# Patient Record
Sex: Male | Born: 1995 | Race: Black or African American | Hispanic: No | Marital: Single | Smoking: Never smoker
Health system: Southern US, Community
[De-identification: ages and names within clinical notes are randomized; demographics above are authoritative.]

## PROBLEM LIST (undated history)

## (undated) DIAGNOSIS — J45909 Unspecified asthma, uncomplicated: Secondary | ICD-10-CM

## (undated) DIAGNOSIS — R011 Cardiac murmur, unspecified: Secondary | ICD-10-CM

---
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
## 2017-01-02 IMAGING — DX DG FOOT COMPLETE 3+V*L*
3 series · 3 of 3 positions shown · non-contrast
Comparison: None.

CLINICAL DATA: Fall while playing basketball today. Left foot pain
located medially and laterally and at the anterior ankle. Initial
encounter.

EXAM:
LEFT FOOT - COMPLETE 3+ VIEW

[x foot lat left]
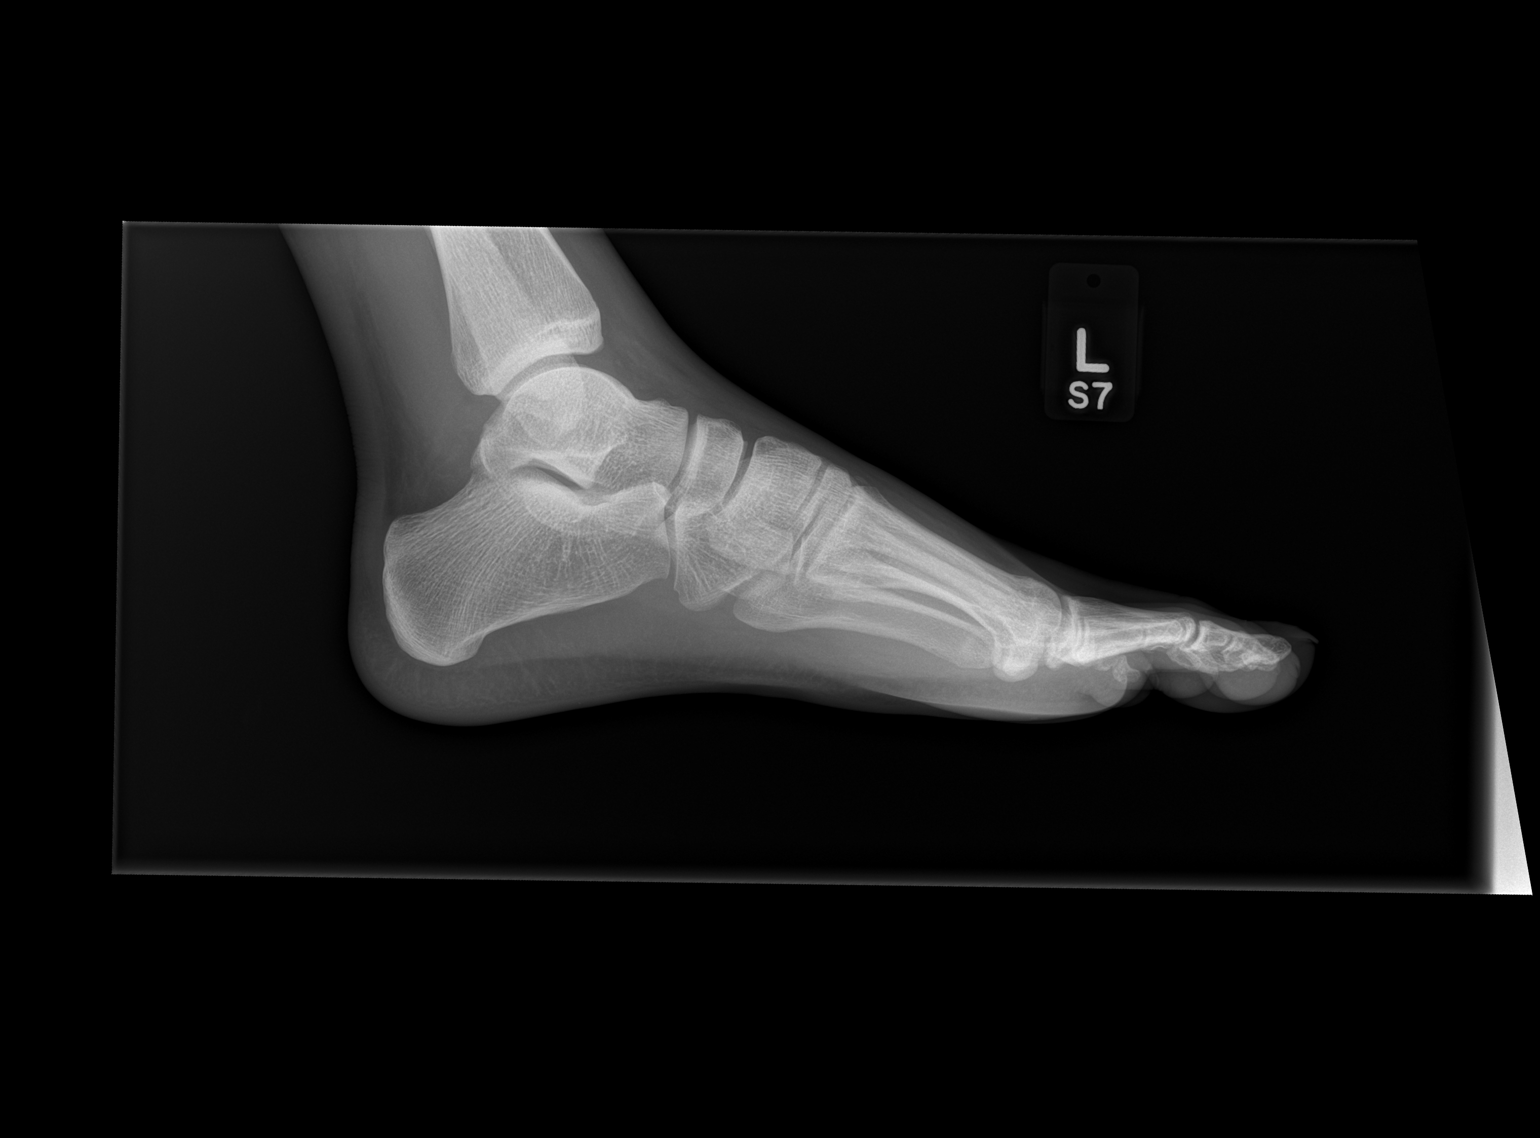

[x foot obl left]
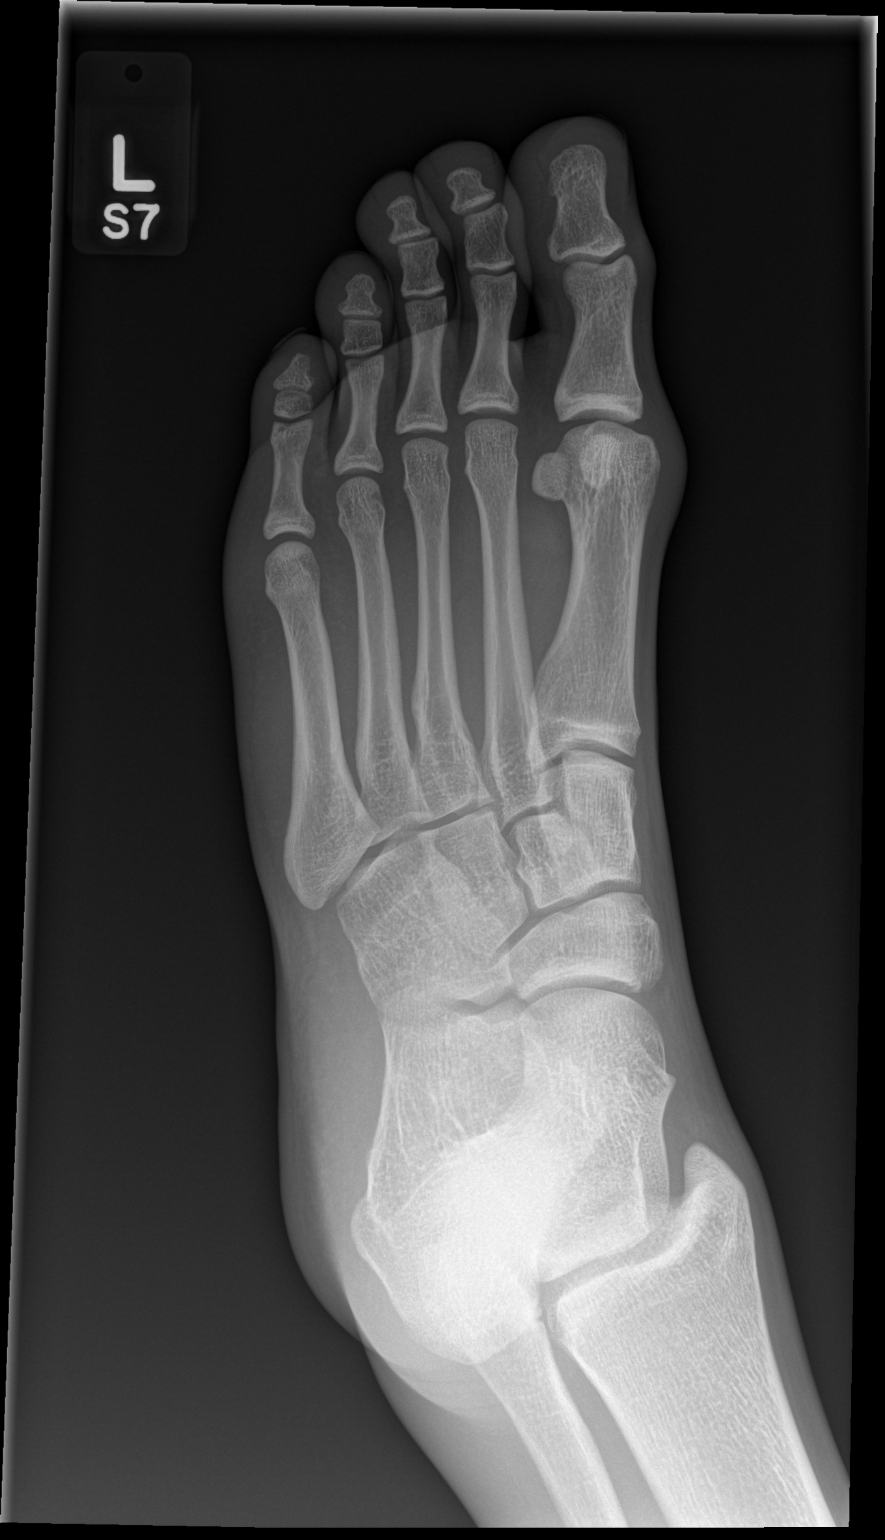

[x foot ap left]
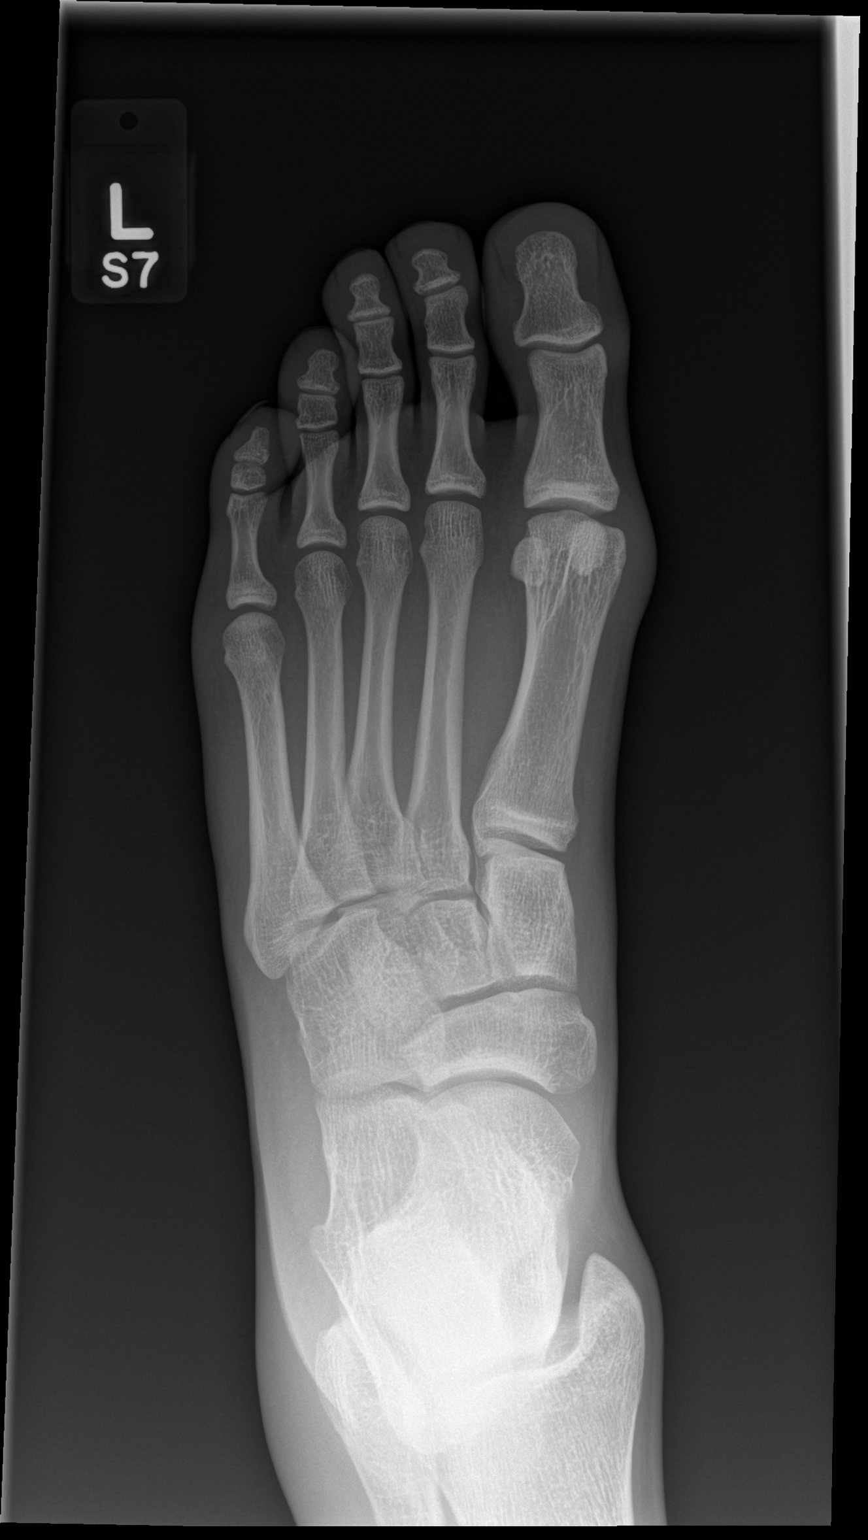

[3 of 3 positions shown; findings below may reference images not displayed]

FINDINGS: Soft tissue swelling is noted at the lateral aspect of the ankle,
more fully evaluated on separate dedicated ankle radiographs. No
acute fracture or dislocation is seen. Joint space widths are
preserved. No lytic or blastic osseous lesion or radiopaque foreign
body.
IMPRESSION: No acute osseous abnormality identified. Ankle soft tissue swelling.

## 2017-01-02 IMAGING — DX DG ANKLE COMPLETE 3+V*L*
3 series · 3 of 3 positions shown · non-contrast
Comparison: None.

CLINICAL DATA: Acute left foot pain after injury playing basketball
today. Initial encounter.

EXAM:
LEFT ANKLE COMPLETE - 3+ VIEW

[x ankle ap left]
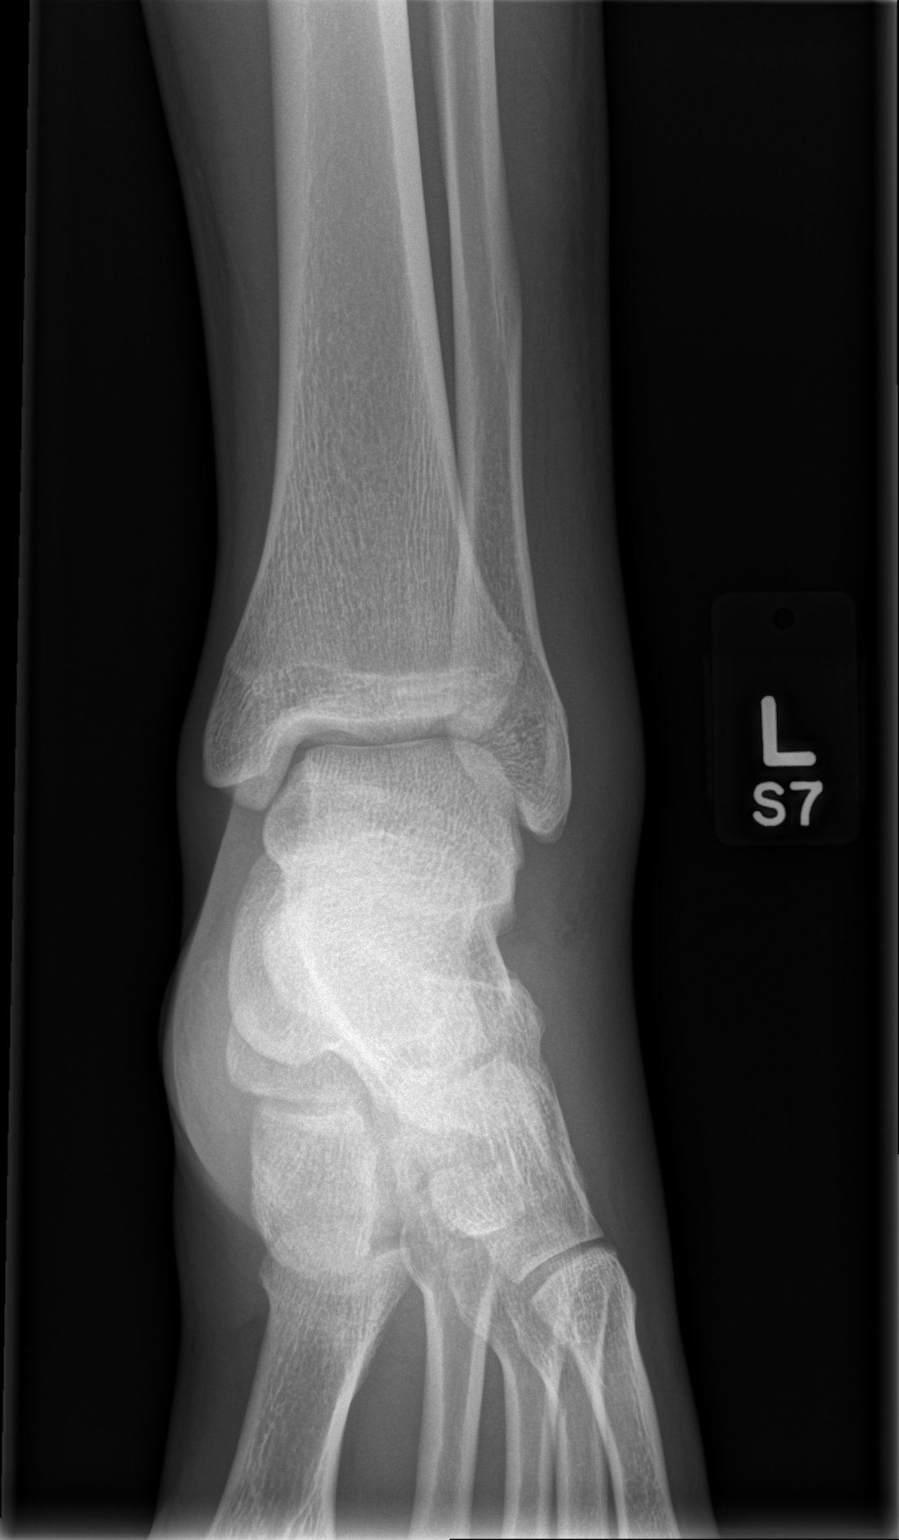

[x ankle obl left]
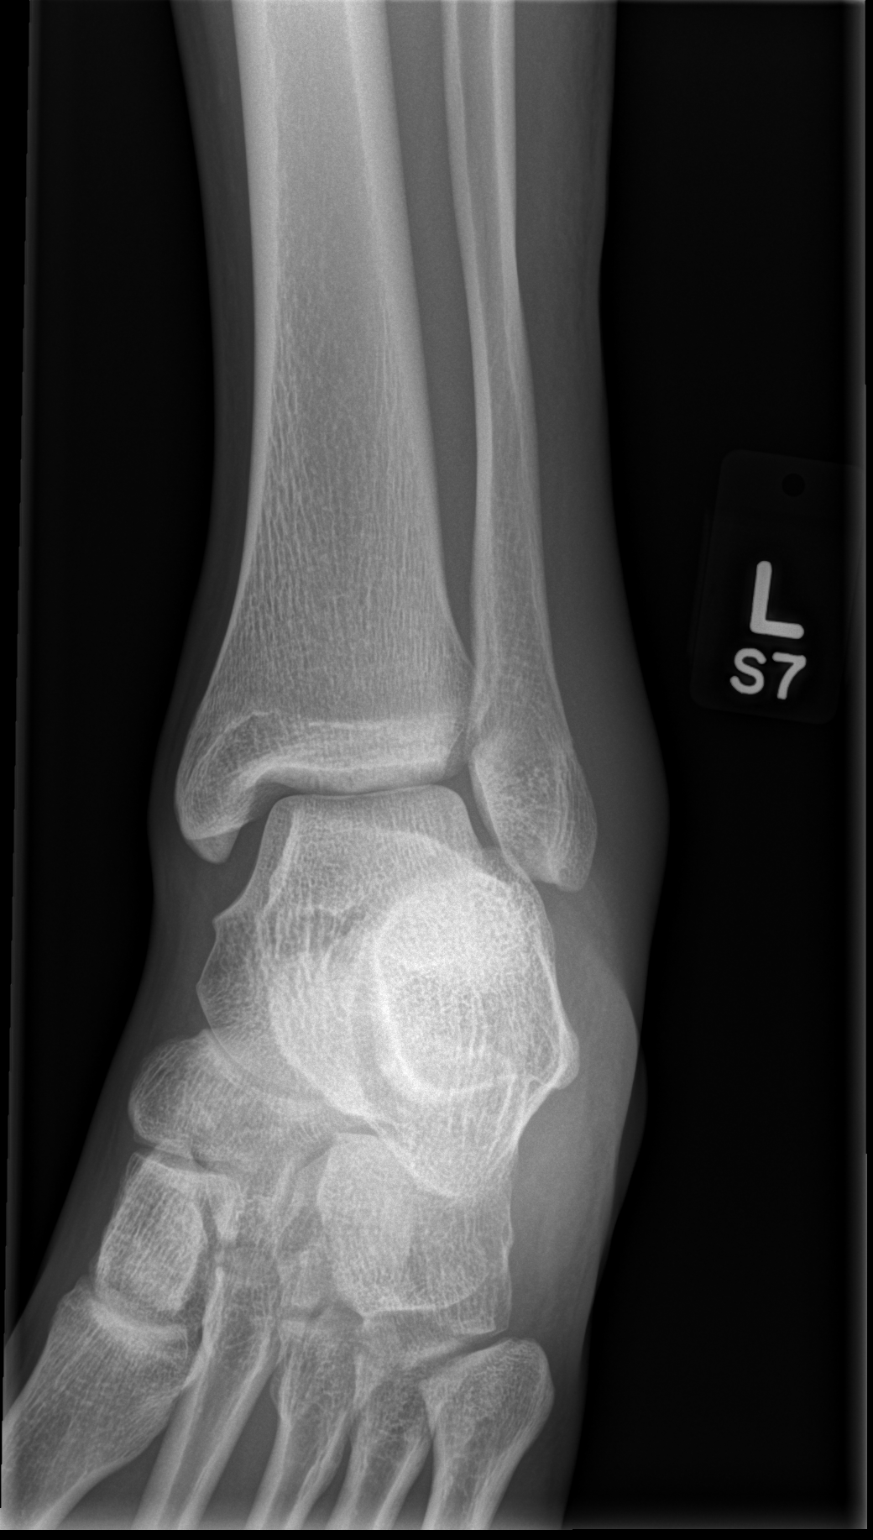

[x ankle lat left]
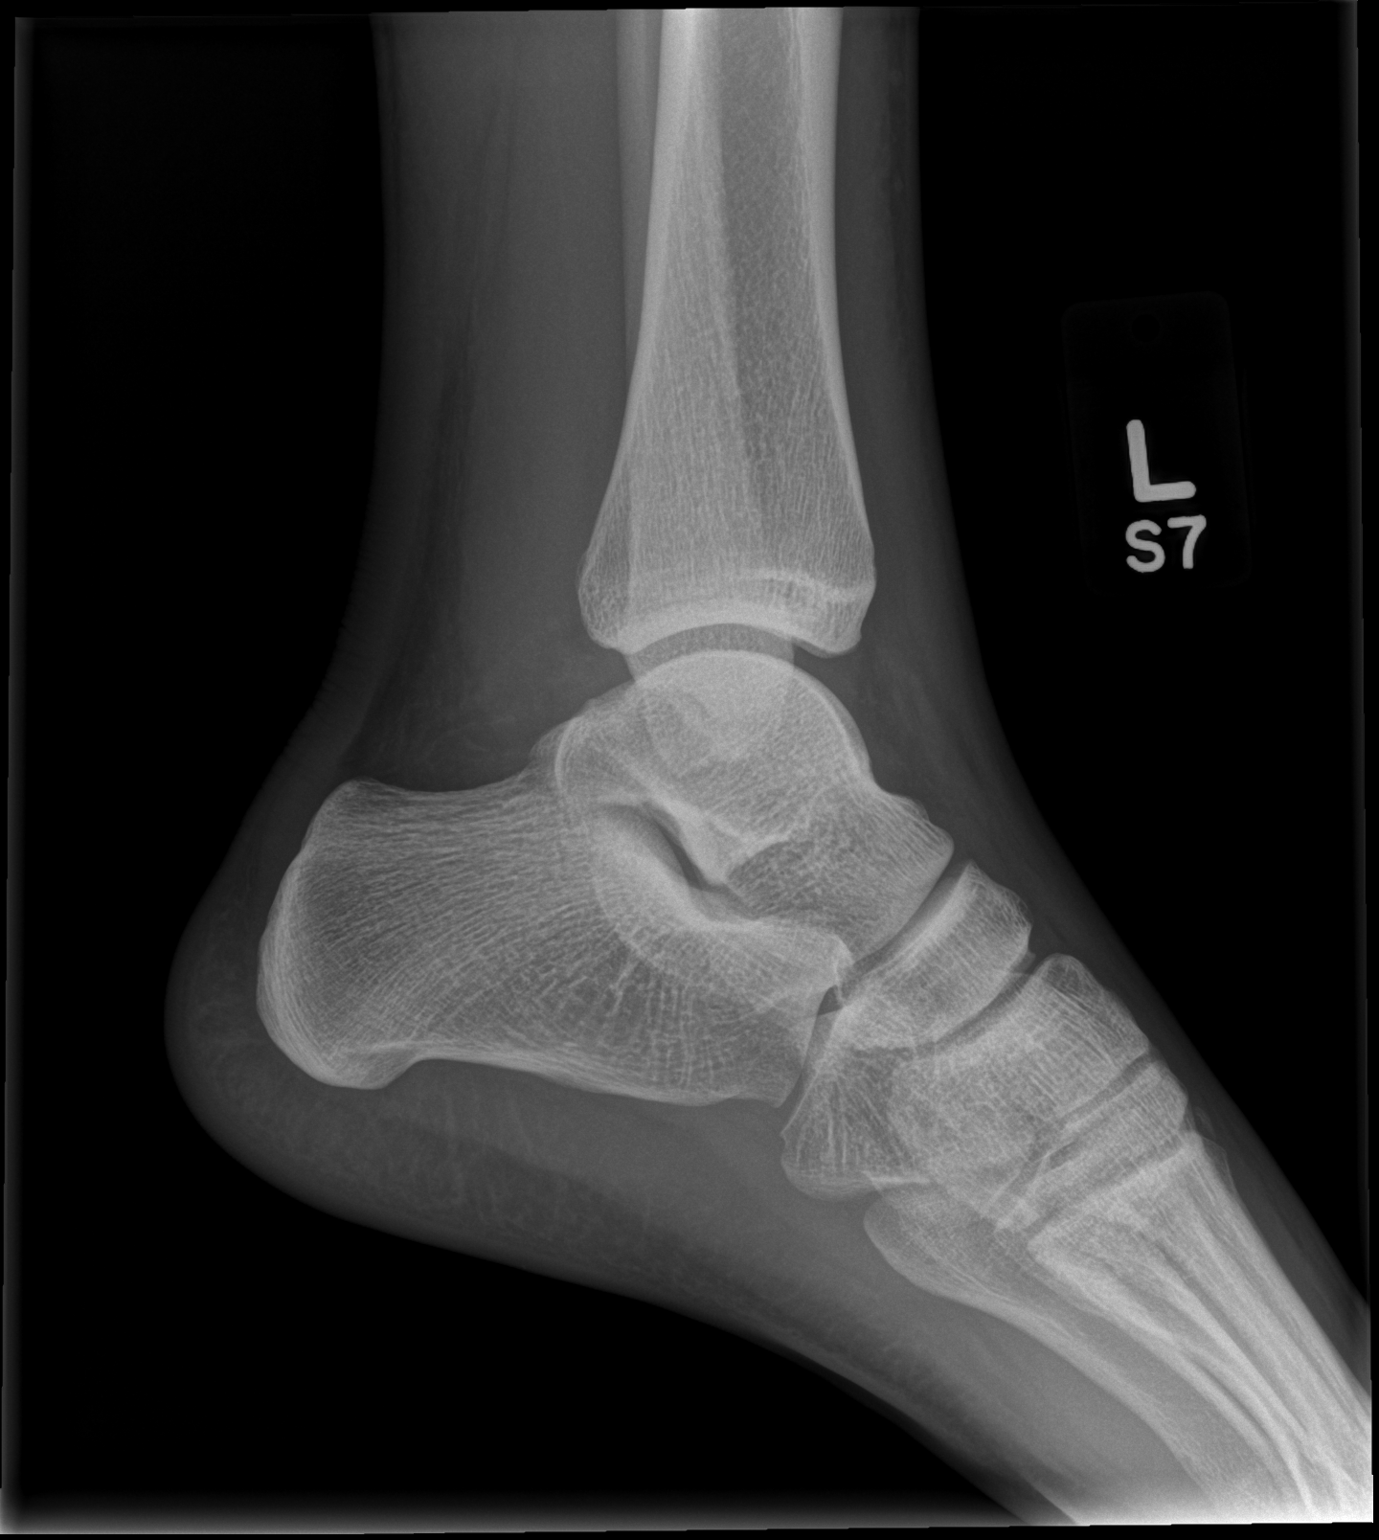

[3 of 3 positions shown; findings below may reference images not displayed]

FINDINGS: There is no evidence of fracture, dislocation, or joint effusion.
There is no evidence of arthropathy or other focal bone abnormality.
Soft tissue swelling is seen over the lateral malleolus suggesting
ligamentous injury.
IMPRESSION: No fracture or dislocation is noted. Soft tissue swelling is seen
over the lateral malleolus suggesting ligamentous injury.

## 2017-12-15 ENCOUNTER — Emergency Department (HOSPITAL_COMMUNITY): Payer: Self-pay

## 2017-12-15 ENCOUNTER — Emergency Department (HOSPITAL_COMMUNITY)
Admission: EM | Admit: 2017-12-15 | Discharge: 2017-12-15 | Disposition: A | Discharge status: Home / Self Care | Payer: Self-pay | Attending: Emergency Medicine | Admitting: Emergency Medicine

## 2017-12-15 ENCOUNTER — Encounter (HOSPITAL_COMMUNITY): Payer: Self-pay | Admitting: Emergency Medicine

## 2017-12-15 ENCOUNTER — Other Ambulatory Visit: Payer: Self-pay

## 2017-12-15 DIAGNOSIS — J45990 Exercise induced bronchospasm: Secondary | ICD-10-CM | POA: Insufficient documentation

## 2017-12-15 DIAGNOSIS — J45909 Unspecified asthma, uncomplicated: Secondary | ICD-10-CM | POA: Insufficient documentation

## 2017-12-15 HISTORY — DX: Unspecified asthma, uncomplicated: J45.909

## 2017-12-15 MED ORDER — IPRATROPIUM BROMIDE 0.02 % IN SOLN
0.5000 mg | Freq: Once | RESPIRATORY_TRACT | Status: AC
  Administered 2017-12-15: 0.5 mg via RESPIRATORY_TRACT
  Filled 2017-12-15: qty 2.5

## 2017-12-15 MED ORDER — ALBUTEROL SULFATE HFA 108 (90 BASE) MCG/ACT IN AERS: 2.0000 | INHALATION_SPRAY | Freq: Four times a day (QID) | RESPIRATORY_TRACT | 1 refills | Status: AC | PRN

## 2017-12-15 MED ORDER — ALBUTEROL SULFATE (2.5 MG/3ML) 0.083% IN NEBU
5.0000 mg | INHALATION_SOLUTION | Freq: Once | RESPIRATORY_TRACT | Status: AC
  Administered 2017-12-15: 5 mg via RESPIRATORY_TRACT
  Filled 2017-12-15: qty 6

## 2017-12-15 MED ORDER — DEXAMETHASONE 4 MG PO TABS
10.0000 mg | ORAL_TABLET | Freq: Once | ORAL | Status: AC
  Administered 2017-12-15: 10 mg via ORAL
  Filled 2017-12-15: qty 3

## 2017-12-15 MED ORDER — ALBUTEROL SULFATE HFA 108 (90 BASE) MCG/ACT IN AERS
2.0000 | INHALATION_SPRAY | Freq: Once | RESPIRATORY_TRACT | Status: AC
  Administered 2017-12-15: 2 via RESPIRATORY_TRACT
  Filled 2017-12-15: qty 6.7

## 2017-12-15 NOTE — ED Notes (Signed)
Patient verbalizes understanding of discharge instructions. Opportunity for questioning and answers were provided. Armband removed by staff, pt discharged from ED ambulatory.   

## 2017-12-15 NOTE — ED Provider Notes (Signed)
Patient placed in Quick Look pathway, seen and evaluated   Chief Complaint: SOB  HPI: Patient with history of asthma, currently with no inhaler, presents with complaint of shortness of breath a sharp chest pain which started while playing basketball.  No lightheadedness or syncope.  He states that this feels like previous asthma attacks.  Symptoms are gradually improving.  States that he had a syncopal episode a couple of years ago and was told that he had a heart murmur.  ROS:  Positive ROS: (+) Chest pain, shortness of breath Negative ROS: (-) Fever, cough Physical Exam:   Gen: No distress  Neuro: Awake and Alert  Skin: Warm    Focused Exam: Heart RRR, nml S1,S2, no m/r/g; Lungs clear but generally diminished, no wheezing; Abd soft, NT, no rebound or guarding; Ext 2+ pedal pulses bilaterally, no edema.  BP 122/75 (BP Location: Left Arm)   Pulse 88   Temp 98.4 F (36.9 C) (Oral)   Resp 18   SpO2 100%   Plan: We will give breathing treatment to see if this helps symptoms.  EKG reviewed.  Given chest pain, will check chest x-ray to ensure no pneumothorax.  Initiation of care has begun. The patient has been counseled on the process, plan, and necessity for staying for the completion/evaluation, and the remainder of the medical screening examination    Renne CriglerGeiple, Sheri Prows, Cordelia Poche-C 12/15/17 1934    Gerhard MunchLockwood, Robert, MD 12/15/17 2102

## 2017-12-15 NOTE — ED Provider Notes (Signed)
[facility] Provider Note   CSN: [id number] Arrival date & time: 12/15/17  1922     History   Chief Complaint Chief Complaint  Patient presents with  . Shortness of Breath    HPI Connor Curtis is a 22 y.o. male.  22 year old male presents to the emergency department for evaluation of chest tightness and shortness of breath which began today while playing basketball.  He states that he has issues with asthma flares when exercising.  States that he usually uses an albuterol inhaler, but does not have one currently.  Received a breathing treatment in triage and states that his symptoms have greatly improved.  Reports that he is breathing at baseline at this time.  Denies any recent fevers.  No lightheadedness, syncope, nausea, vomiting.     Past Medical History:  Diagnosis Date  . Asthma   . Heart murmur     There are no active problems to display for this patient.   History reviewed. No pertinent surgical history.      Home Medications    Prior to Admission medications   Medication Sig Start Date End Date Taking? Authorizing Provider  albuterol (PROVENTIL HFA;VENTOLIN HFA) 108 (90 Base) MCG/ACT inhaler Inhale 2 puffs into the lungs every 6 (six) hours as needed for wheezing or shortness of breath. 12/15/17   Antony MaduraHumes, Tyre Beaver, PA-C  ibuprofen (ADVIL,MOTRIN) 600 MG tablet Take 1 tab PO Q6h x 2 days then Q6h prn Patient not taking: Reported on 12/15/2017 01/10/15   Lowanda FosterBrewer, Mindy, NP    Family History No family history on file.  Social History Social History   Tobacco Use  . Smoking status: Never Smoker  . Smokeless tobacco: Never Used  Substance Use Topics  . Alcohol use: No  . Drug use: No     Allergies   Patient has no known allergies.   Review of Systems Review of Systems Ten systems reviewed and are negative for acute change, except as noted in the HPI.    Physical Exam Updated Vital Signs BP 131/76   Pulse (!)  57   Temp 98.4 F (36.9 C) (Oral)   Resp 14   SpO2 100%   Physical Exam  Constitutional: He is oriented to person, place, and time. He appears well-developed and well-nourished. No distress.  Nontoxic appearing and in NAD  HENT:  Head: Normocephalic and atraumatic.  Eyes: Conjunctivae and EOM are normal. No scleral icterus.  Neck: Normal range of motion.  Cardiovascular: Normal rate, regular rhythm and intact distal pulses.  Pulmonary/Chest: Effort normal. No stridor. No respiratory distress. He has no wheezes. He has no rales.  Lungs CTAB. Respirations even and unlabored.  Musculoskeletal: Normal range of motion.  Neurological: He is alert and oriented to person, place, and time. He exhibits normal muscle tone. Coordination normal.  Skin: Skin is warm and dry. No rash noted. He is not diaphoretic. No erythema. No pallor.  Psychiatric: He has a normal mood and affect. His behavior is normal.  Nursing note and vitals reviewed.    ED Treatments / Results  Labs (all labs ordered are listed, but only abnormal results are displayed) Labs Reviewed - No data to display  EKG None  Radiology Dg Chest 2 View  Result Date: 12/15/2017 CLINICAL DATA:  Shortness of breath and chest tightness EXAM: CHEST - 2 VIEW COMPARISON:  None. FINDINGS: Lungs are clear. Heart size and pulmonary vascularity are normal. No adenopathy. No pneumothorax. No bone lesions. IMPRESSION:  No edema or consolidation. Electronically Signed   By: Bretta BangWilliam  Woodruff III M.D.   On: 12/15/2017 20:27    Procedures Procedures (including critical care time)  Medications Ordered in ED Medications  albuterol (PROVENTIL) (2.5 MG/3ML) 0.083% nebulizer solution 5 mg (5 mg Nebulization Given 12/15/17 1939)  ipratropium (ATROVENT) nebulizer solution 0.5 mg (0.5 mg Nebulization Given 12/15/17 1939)  dexamethasone (DECADRON) tablet 10 mg (10 mg Oral Given 12/15/17 2239)  albuterol (PROVENTIL HFA;VENTOLIN HFA) 108 (90 Base) MCG/ACT  inhaler 2 puff (2 puffs Inhalation Given 12/15/17 2240)     Initial Impression / Assessment and Plan / ED Course  I have reviewed the triage vital signs and the nursing notes.  Pertinent labs & imaging results that were available during my care of the patient were reviewed by me and considered in my medical decision making (see chart for details).     22 year old male presenting for asthma exacerbation.  History of exercise-induced asthma.  Was given a DuoNeb in triage.  This was supplemented with Decadron.  Albuterol inhaler given to use prior to discharge.  He has been instructed to follow-up with a primary care doctor.  Return precautions discussed and provided. Patient discharged in stable condition with no unaddressed concerns.  Vitals:   12/15/17 1926 12/15/17 2139 12/15/17 2230  BP: 122/75 (!) 93/59 131/76  Pulse: 88 67 (!) 57  Resp: 18 18 14   Temp: 98.4 F (36.9 C)    TempSrc: Oral    SpO2: 100% 100% 100%    Final Clinical Impressions(s) / ED Diagnoses   Final diagnoses:  Exercise-induced asthma    ED Discharge Orders         Ordered    albuterol (PROVENTIL HFA;VENTOLIN HFA) 108 (90 Base) MCG/ACT inhaler  Every 6 hours PRN     12/15/17 2235           Antony MaduraHumes, Ottis Vacha, PA-C 12/15/17 2335    Tegeler, Canary Brimhristopher J, MD 12/16/17 [phone]

## 2017-12-15 NOTE — ED Triage Notes (Signed)
C/o sob and sharp "tightness" to center of chest that started while playing basketball just prior to arrival.  States he has history of asthma.  Out of inhaler.  PA in for MSE at triage.

## 2019-12-08 IMAGING — DX DG CHEST 2V
2 series · 2 of 2 positions shown · non-contrast
Comparison: None.

CLINICAL DATA: Shortness of breath and chest tightness

EXAM:
CHEST - 2 VIEW

[w chest pa]
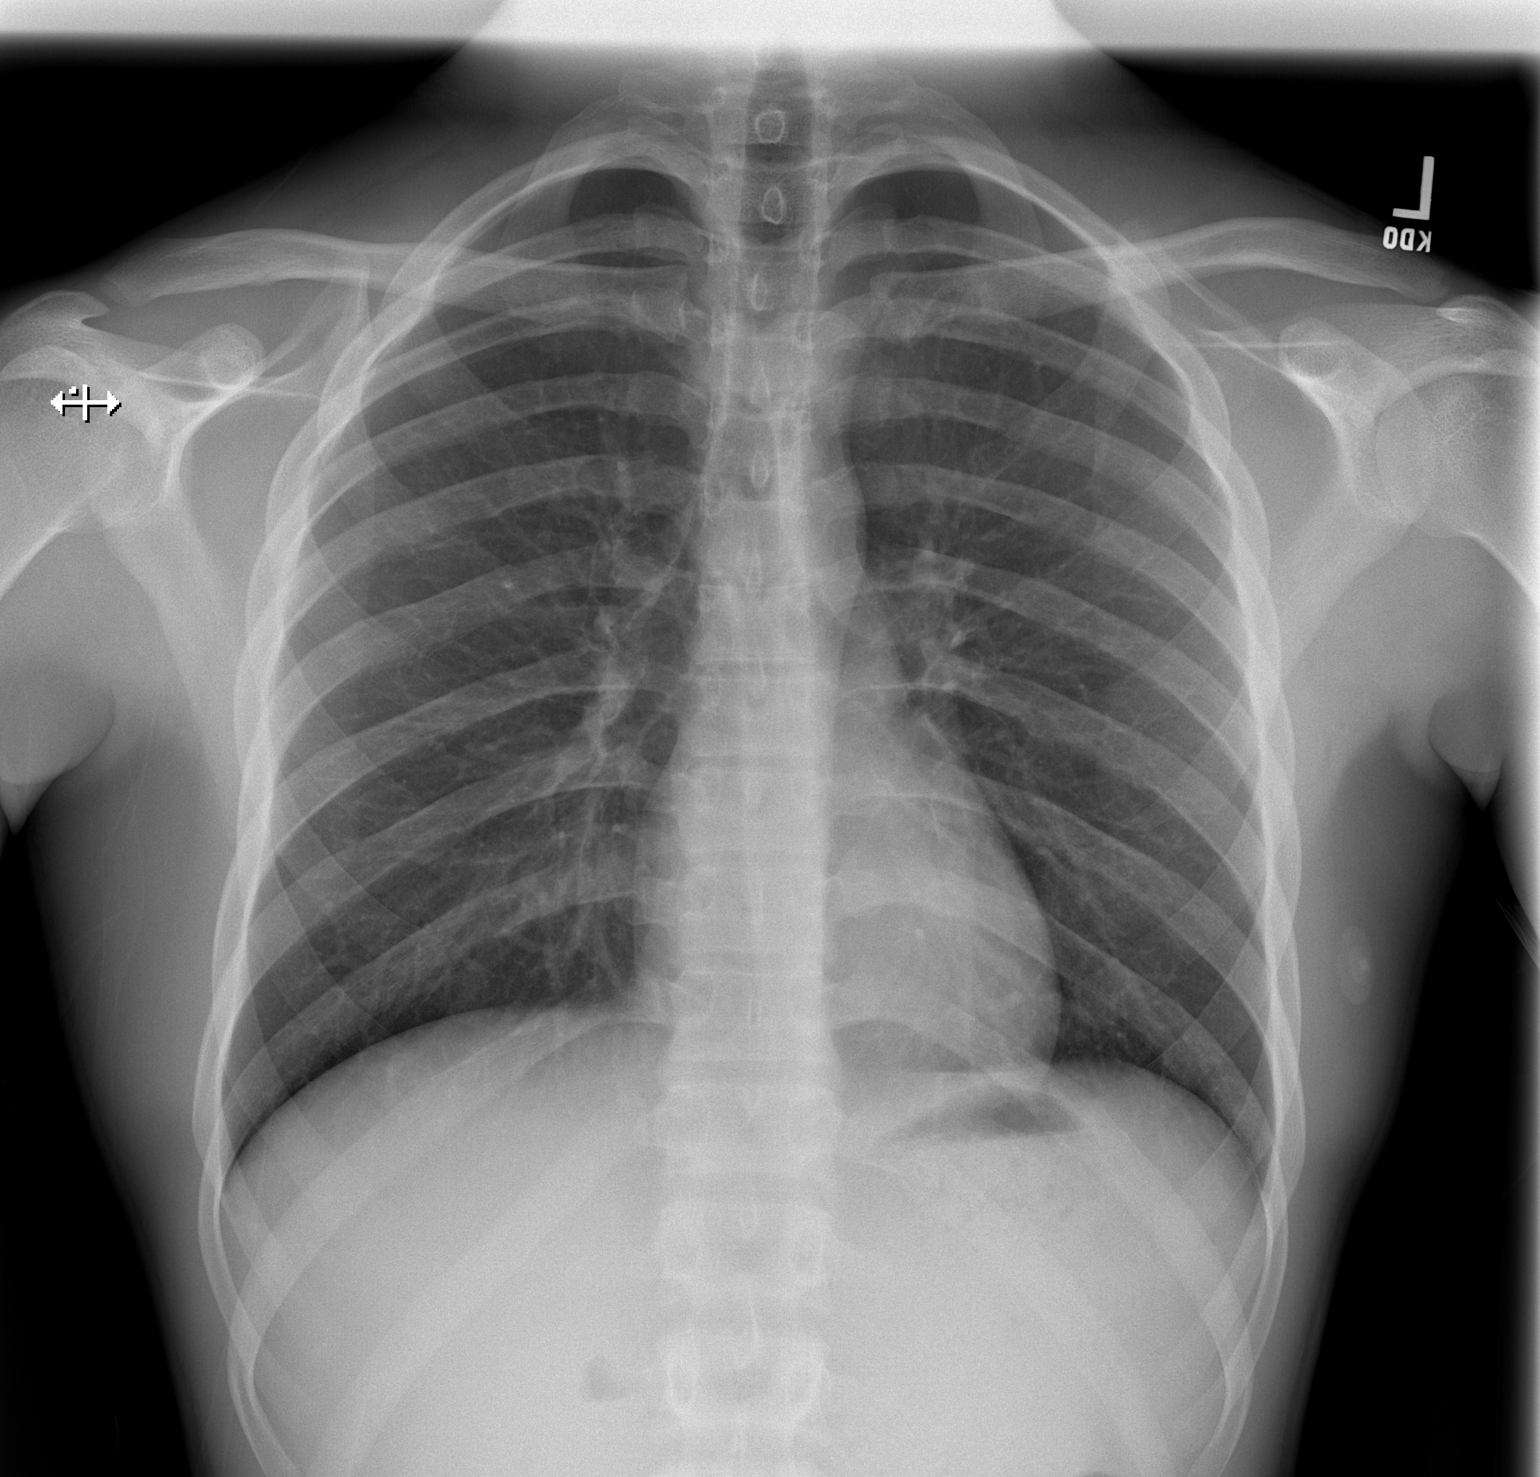

[w chest lat]
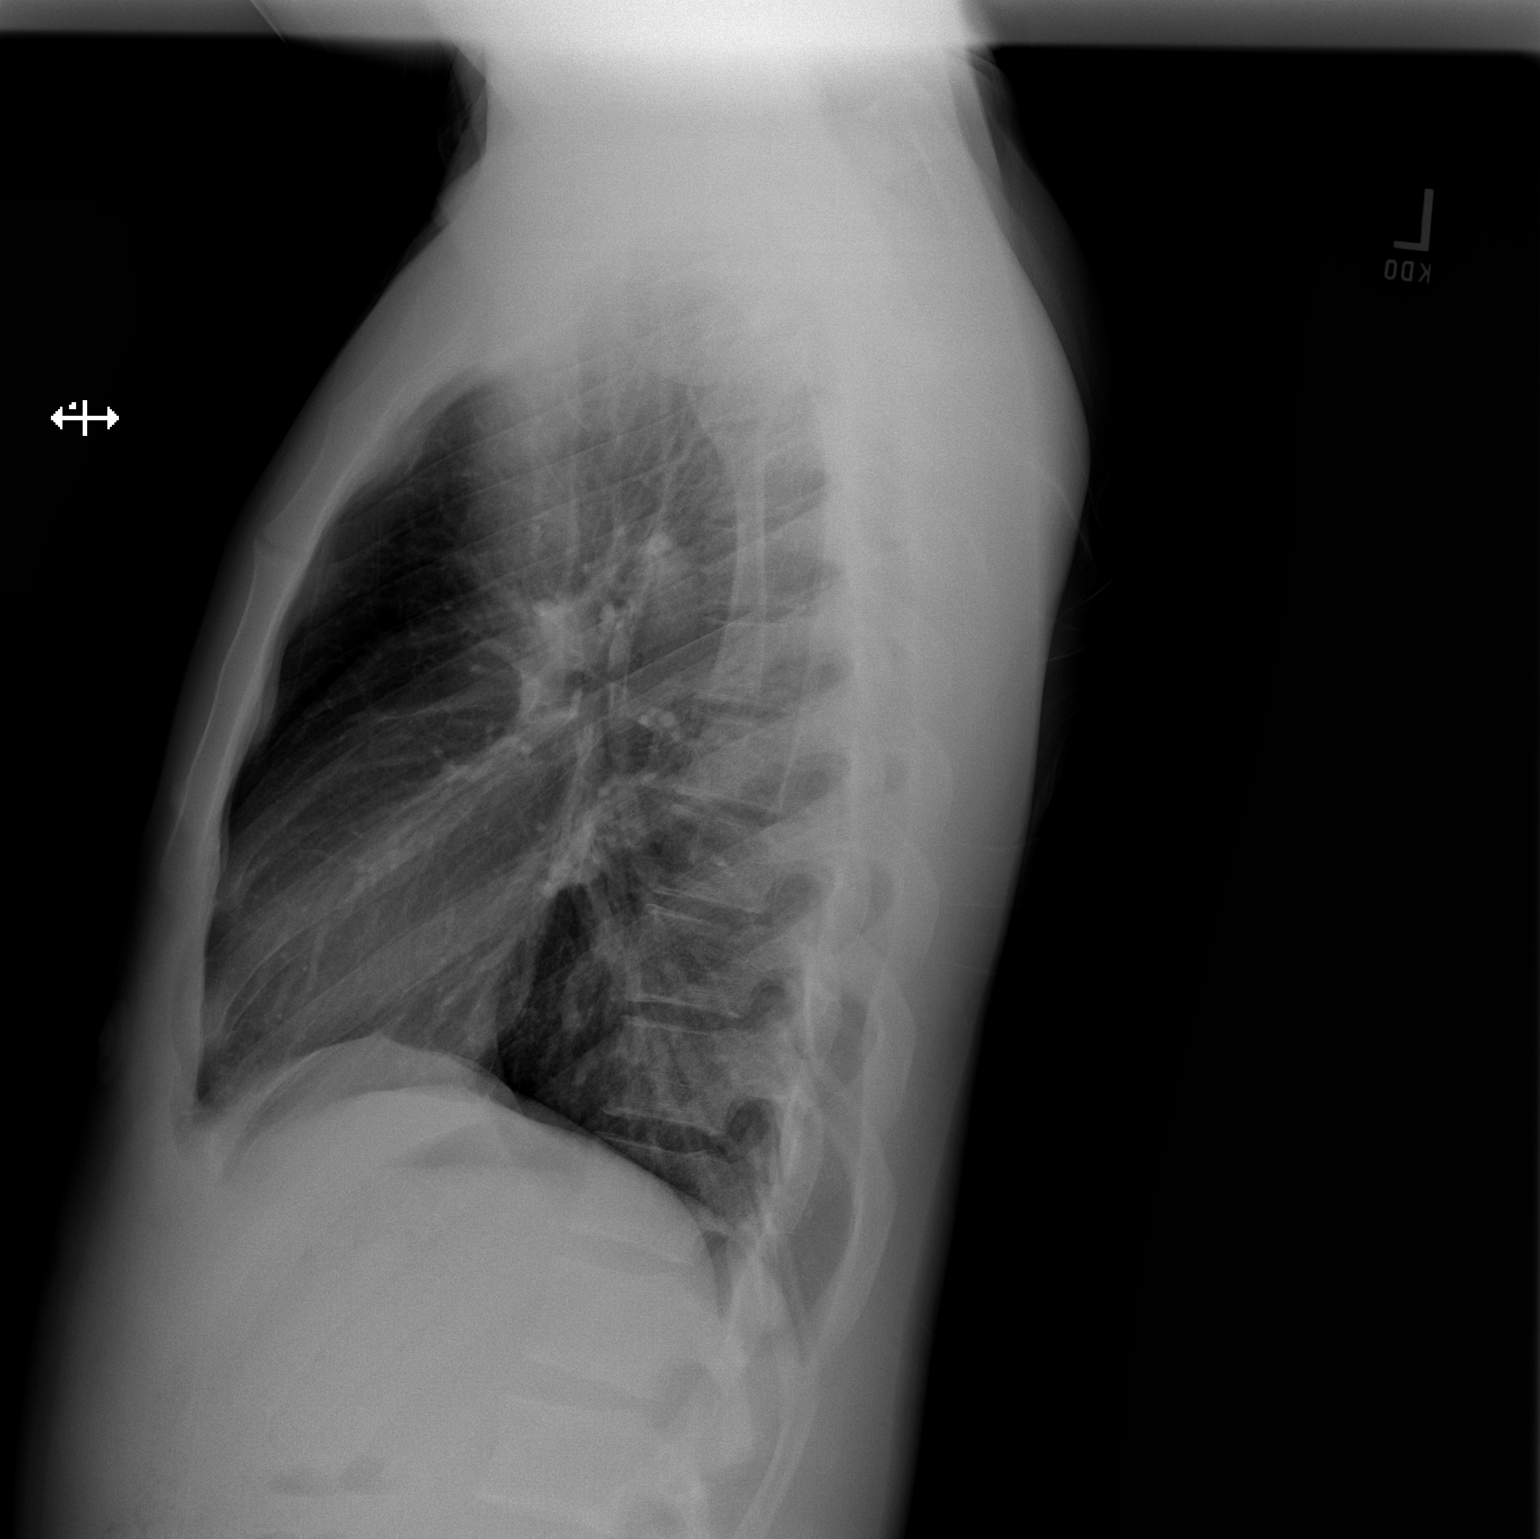

[2 of 2 positions shown; findings below may reference images not displayed]

FINDINGS: Lungs are clear. Heart size and pulmonary vascularity are normal. No
adenopathy. No pneumothorax. No bone lesions.
IMPRESSION: No edema or consolidation.

## 2021-11-16 ENCOUNTER — Encounter: Payer: Self-pay | Admitting: Emergency Medicine

## 2021-11-16 ENCOUNTER — Emergency Department: Payer: No Typology Code available for payment source

## 2021-11-16 ENCOUNTER — Other Ambulatory Visit: Payer: Self-pay

## 2021-11-16 ENCOUNTER — Emergency Department
Admission: EM | Admit: 2021-11-16 | Discharge: 2021-11-16 | Disposition: A | Discharge status: Home / Self Care | Payer: No Typology Code available for payment source | Attending: Emergency Medicine | Admitting: Emergency Medicine

## 2021-11-16 DIAGNOSIS — M542 Cervicalgia: Secondary | ICD-10-CM | POA: Diagnosis present

## 2021-11-16 DIAGNOSIS — Y92524 Gas station as the place of occurrence of the external cause: Secondary | ICD-10-CM | POA: Insufficient documentation

## 2021-11-16 DIAGNOSIS — R519 Headache, unspecified: Secondary | ICD-10-CM | POA: Insufficient documentation

## 2021-11-16 MED ORDER — ACETAMINOPHEN 325 MG PO TABS
650.0000 mg | ORAL_TABLET | Freq: Once | ORAL | Status: AC
  Administered 2021-11-16: 650 mg via ORAL
  Filled 2021-11-16: qty 2

## 2021-11-16 MED ORDER — LIDOCAINE 5 % EX PTCH
1.0000 | MEDICATED_PATCH | CUTANEOUS | Status: DC
  Administered 2021-11-16: 1 via TRANSDERMAL
  Filled 2021-11-16: qty 1

## 2021-11-16 MED ORDER — NAPROXEN 500 MG PO TABS: 500.0000 mg | ORAL_TABLET | Freq: Two times a day (BID) | ORAL | 2 refills | Status: AC

## 2021-11-16 MED ORDER — IBUPROFEN 600 MG PO TABS
600.0000 mg | ORAL_TABLET | Freq: Once | ORAL | Status: AC
  Administered 2021-11-16: 600 mg via ORAL
  Filled 2021-11-16: qty 1

## 2021-11-16 MED ORDER — LIDOCAINE 5 % EX PTCH: 1.0000 | MEDICATED_PATCH | Freq: Two times a day (BID) | CUTANEOUS | 0 refills | Status: AC

## 2021-11-16 MED ORDER — CYCLOBENZAPRINE HCL 10 MG PO TABS
5.0000 mg | ORAL_TABLET | Freq: Once | ORAL | Status: AC
  Administered 2021-11-16: 5 mg via ORAL
  Filled 2021-11-16: qty 1
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
## 2021-11-16 NOTE — Discharge Instructions (Signed)
You may take the medications as prescribed.  Please return for any new, worsening, or change in symptoms or other concerns including worsening headache, dizziness, vision changes, ringing in your ears, chest pain or abdominal pain, numbness or weakness in your arms or legs, or any other concerns.  It was a pleasure caring for you today.

## 2021-11-16 NOTE — ED Notes (Signed)
E-signature pad unavailable - Pt verbalized understanding of D/C information - no additional concerns at this time.  
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
## 2021-11-30 ENCOUNTER — Other Ambulatory Visit: Payer: Self-pay

## 2021-11-30 ENCOUNTER — Emergency Department (HOSPITAL_COMMUNITY)
Admission: EM | Admit: 2021-11-30 | Discharge: 2021-11-30 | Disposition: A | Discharge status: Home / Self Care | Payer: No Typology Code available for payment source | Attending: Emergency Medicine | Admitting: Emergency Medicine

## 2021-11-30 DIAGNOSIS — R519 Headache, unspecified: Secondary | ICD-10-CM | POA: Diagnosis not present

## 2021-11-30 DIAGNOSIS — R112 Nausea with vomiting, unspecified: Secondary | ICD-10-CM | POA: Diagnosis not present

## 2021-11-30 DIAGNOSIS — M542 Cervicalgia: Secondary | ICD-10-CM | POA: Insufficient documentation

## 2021-11-30 DIAGNOSIS — Y9389 Activity, other specified: Secondary | ICD-10-CM | POA: Insufficient documentation

## 2021-11-30 DIAGNOSIS — Y92524 Gas station as the place of occurrence of the external cause: Secondary | ICD-10-CM | POA: Diagnosis not present

## 2021-11-30 DIAGNOSIS — S060X0D Concussion without loss of consciousness, subsequent encounter: Secondary | ICD-10-CM

## 2021-11-30 LAB — CBC WITH DIFFERENTIAL/PLATELET
Abs Immature Granulocytes: 0.01 10*3/uL (ref 0.00–0.07)
Basophils Absolute: 0 10*3/uL (ref 0.0–0.1)
Basophils Relative: 0 %
Eosinophils Absolute: 0 10*3/uL (ref 0.0–0.5)
Eosinophils Relative: 0 %
HCT: 47.1 % (ref 39.0–52.0)
Hemoglobin: 15.8 g/dL (ref 13.0–17.0)
Immature Granulocytes: 0 %
Lymphocytes Relative: 24 %
Lymphs Abs: 1.2 10*3/uL (ref 0.7–4.0)
MCH: 29.2 pg (ref 26.0–34.0)
MCHC: 33.5 g/dL (ref 30.0–36.0)
MCV: 87.1 fL (ref 80.0–100.0)
Monocytes Absolute: 0.4 10*3/uL (ref 0.1–1.0)
Monocytes Relative: 8 %
Neutro Abs: 3.4 10*3/uL (ref 1.7–7.7)
Neutrophils Relative %: 68 %
Platelets: 227 10*3/uL (ref 150–400)
RBC: 5.41 MIL/uL (ref 4.22–5.81)
RDW: 12.7 % (ref 11.5–15.5)
WBC: 5 10*3/uL (ref 4.0–10.5)
nRBC: 0 % (ref 0.0–0.2)

## 2021-11-30 LAB — COMPREHENSIVE METABOLIC PANEL
ALT: 14 U/L (ref 0–44)
AST: 17 U/L (ref 15–41)
Albumin: 4.8 g/dL (ref 3.5–5.0)
Alkaline Phosphatase: 75 U/L (ref 38–126)
Anion gap: 7 (ref 5–15)
BUN: 8 mg/dL (ref 6–20)
CO2: 26 mmol/L (ref 22–32)
Calcium: 9.7 mg/dL (ref 8.9–10.3)
Chloride: 105 mmol/L (ref 98–111)
Creatinine, Ser: 1.13 mg/dL (ref 0.61–1.24)
GFR, Estimated: >60 mL/min (ref >60)
Glucose, Bld: 113 mg/dL — ABNORMAL HIGH (ref 70–99)
Potassium: 4 mmol/L (ref 3.5–5.1)
Sodium: 138 mmol/L (ref 135–145)
Total Bilirubin: 0.4 mg/dL (ref 0.3–1.2)
Total Protein: 7.3 g/dL (ref 6.5–8.1)

## 2021-11-30 LAB — URINALYSIS, ROUTINE W REFLEX MICROSCOPIC
Bilirubin Urine: NEGATIVE
Glucose, UA: NEGATIVE mg/dL
Hgb urine dipstick: NEGATIVE
Ketones, ur: NEGATIVE mg/dL
Leukocytes,Ua: NEGATIVE
Nitrite: NEGATIVE
Protein, ur: NEGATIVE mg/dL
Specific Gravity, Urine: 1.028 (ref 1.005–1.030)
pH: 7 (ref 5.0–8.0)

## 2021-11-30 LAB — RAPID URINE DRUG SCREEN, HOSP PERFORMED
Amphetamines: NOT DETECTED
Barbiturates: NOT DETECTED
Benzodiazepines: NOT DETECTED
Cocaine: NOT DETECTED
Opiates: NOT DETECTED
Tetrahydrocannabinol: POSITIVE — AB

## 2021-11-30 LAB — LIPASE, BLOOD: Lipase: 20 U/L (ref 11–51)

## 2021-11-30 MED ORDER — METHOCARBAMOL 500 MG PO TABS: 500.0000 mg | ORAL_TABLET | Freq: Two times a day (BID) | ORAL | 0 refills | Status: AC
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
## 2021-11-30 NOTE — Discharge Instructions (Addendum)
You were seen today in the emergency department for headache and vomiting.  Your work-up today was reassuring, the imaging done 11 days ago was negative for any emergent process.  I think her symptoms suggest a concussion, information above to schedule an appointment with Dr. Katrinka Blazing at [facility].  Return to the emergency department if you have severe abdominal pain, blood in your stool, weakness to 1 side of your body, vision changes loss of bladder or bowel function.  Continue taking the Motrin, he can also add Tylenol.  I sent Robaxin to your pharmacy, this is a muscle relaxer.  Only take it at night as it can impair your ability to drive.
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
